# Patient Record
Sex: Female | Born: 1975
Health system: Southern US, Community
[De-identification: ages and names within clinical notes are randomized; demographics above are authoritative.]

## PROBLEM LIST (undated history)

## (undated) DIAGNOSIS — E785 Hyperlipidemia, unspecified: Secondary | ICD-10-CM

---

## 2004-07-07 ENCOUNTER — Emergency Department (HOSPITAL_COMMUNITY): Admission: EM | Admit: 2004-07-07 | Discharge: 2004-07-07 | Payer: Self-pay | Admitting: Emergency Medicine

## 2006-02-10 ENCOUNTER — Ambulatory Visit: Payer: Self-pay

## 2006-05-28 ENCOUNTER — Emergency Department: Payer: Self-pay | Admitting: Emergency Medicine

## 2007-09-14 ENCOUNTER — Ambulatory Visit: Payer: Self-pay | Admitting: Family Medicine

## 2008-02-14 ENCOUNTER — Emergency Department: Payer: Self-pay | Admitting: Emergency Medicine

## 2009-01-03 ENCOUNTER — Emergency Department: Payer: Self-pay | Admitting: Emergency Medicine

## 2009-10-30 ENCOUNTER — Ambulatory Visit: Payer: Self-pay | Admitting: Internal Medicine

## 2012-01-24 ENCOUNTER — Ambulatory Visit: Payer: Self-pay | Admitting: Emergency Medicine

## 2012-01-24 LAB — COMPREHENSIVE METABOLIC PANEL
Anion Gap: 8 (ref 7–16)
Bilirubin,Total: 0.6 mg/dL (ref 0.2–1.0)
Chloride: 103 mmol/L (ref 98–107)
Creatinine: 0.88 mg/dL (ref 0.60–1.30)
EGFR (African American): >60
Glucose: 80 mg/dL (ref 65–99)
Potassium: 3.4 mmol/L — ABNORMAL LOW (ref 3.5–5.1)
SGOT(AST): 14 U/L — ABNORMAL LOW (ref 15–37)
Total Protein: 8.2 g/dL (ref 6.4–8.2)

## 2012-01-24 LAB — CBC WITH DIFFERENTIAL/PLATELET
Basophil #: 0 10*3/uL (ref 0.0–0.1)
Lymphocyte #: 2.1 10*3/uL (ref 1.0–3.6)
MCH: 28 pg (ref 26.0–34.0)
MCV: 85 fL (ref 80–100)
Monocyte #: 0.4 x10 3/mm (ref 0.2–0.9)
Neutrophil %: 54.3 %
Platelet: 183 10*3/uL (ref 150–440)
RDW: 13.2 % (ref 11.5–14.5)
WBC: 5.9 10*3/uL (ref 3.6–11.0)

## 2012-01-24 LAB — IRON: Iron: 68 ug/dL (ref 50–170)

## 2012-03-30 ENCOUNTER — Ambulatory Visit: Payer: Self-pay | Admitting: Gastroenterology

## 2012-04-02 LAB — PATHOLOGY REPORT

## 2014-08-18 ENCOUNTER — Ambulatory Visit: Payer: Self-pay | Admitting: Physician Assistant

## 2016-03-02 ENCOUNTER — Other Ambulatory Visit: Payer: Self-pay | Admitting: Physician Assistant

## 2016-03-02 DIAGNOSIS — Z1231 Encounter for screening mammogram for malignant neoplasm of breast: Secondary | ICD-10-CM

## 2016-03-17 ENCOUNTER — Ambulatory Visit: Payer: Self-pay | Attending: Physician Assistant

## 2018-03-20 ENCOUNTER — Other Ambulatory Visit: Payer: Self-pay

## 2018-03-20 ENCOUNTER — Emergency Department: Payer: No Typology Code available for payment source

## 2018-03-20 ENCOUNTER — Emergency Department
Admission: EM | Admit: 2018-03-20 | Discharge: 2018-03-20 | Disposition: A | Discharge status: Home / Self Care | Payer: No Typology Code available for payment source | Attending: Emergency Medicine | Admitting: Emergency Medicine

## 2018-03-20 ENCOUNTER — Encounter: Payer: Self-pay | Admitting: Emergency Medicine

## 2018-03-20 DIAGNOSIS — R202 Paresthesia of skin: Secondary | ICD-10-CM | POA: Diagnosis not present

## 2018-03-20 DIAGNOSIS — R200 Anesthesia of skin: Secondary | ICD-10-CM | POA: Diagnosis present

## 2018-03-20 DIAGNOSIS — G459 Transient cerebral ischemic attack, unspecified: Secondary | ICD-10-CM

## 2018-03-20 HISTORY — DX: Hyperlipidemia, unspecified: E78.5

## 2018-03-20 LAB — COMPREHENSIVE METABOLIC PANEL
ALBUMIN: 3.9 g/dL (ref 3.5–5.0)
ALK PHOS: 27 U/L — AB (ref 38–126)
ALT: 12 U/L (ref 0–44)
AST: 18 U/L (ref 15–41)
Anion gap: 6 (ref 5–15)
BILIRUBIN TOTAL: 0.8 mg/dL (ref 0.3–1.2)
BUN: 11 mg/dL (ref 6–20)
CALCIUM: 8.7 mg/dL — AB (ref 8.9–10.3)
CO2: 22 mmol/L (ref 22–32)
CREATININE: 0.88 mg/dL (ref 0.44–1.00)
Chloride: 110 mmol/L (ref 98–111)
GFR calc Af Amer: >60 mL/min (ref >60)
GFR calc non Af Amer: >60 mL/min (ref >60)
GLUCOSE: 110 mg/dL — AB (ref 70–99)
Potassium: 3.8 mmol/L (ref 3.5–5.1)
SODIUM: 138 mmol/L (ref 135–145)
Total Protein: 7.3 g/dL (ref 6.5–8.1)

## 2018-03-20 LAB — CBC
HCT: 39.9 % (ref 35.0–47.0)
HEMOGLOBIN: 13.4 g/dL (ref 12.0–16.0)
MCH: 28.7 pg (ref 26.0–34.0)
MCHC: 33.7 g/dL (ref 32.0–36.0)
MCV: 85.3 fL (ref 80.0–100.0)
PLATELETS: 208 10*3/uL (ref 150–440)
RBC: 4.68 MIL/uL (ref 3.80–5.20)
RDW: 12.7 % (ref 11.5–14.5)
WBC: 5.8 10*3/uL (ref 3.6–11.0)

## 2018-03-20 LAB — URINALYSIS, ROUTINE W REFLEX MICROSCOPIC
BACTERIA UA: NONE SEEN
Bilirubin Urine: NEGATIVE
Glucose, UA: NEGATIVE mg/dL
Ketones, ur: NEGATIVE mg/dL
LEUKOCYTES UA: NEGATIVE
Nitrite: NEGATIVE
Protein, ur: NEGATIVE mg/dL
SPECIFIC GRAVITY, URINE: 1.008 (ref 1.005–1.030)
pH: 6 (ref 5.0–8.0)

## 2018-03-20 LAB — URINE DRUG SCREEN, QUALITATIVE (ARMC ONLY)
Amphetamines, Ur Screen: NOT DETECTED
BARBITURATES, UR SCREEN: NOT DETECTED
Benzodiazepine, Ur Scrn: NOT DETECTED
COCAINE METABOLITE, UR ~~LOC~~: NOT DETECTED
Cannabinoid 50 Ng, Ur ~~LOC~~: NOT DETECTED
MDMA (Ecstasy)Ur Screen: NOT DETECTED
METHADONE SCREEN, URINE: NOT DETECTED
Opiate, Ur Screen: NOT DETECTED
Phencyclidine (PCP) Ur S: NOT DETECTED
Tricyclic, Ur Screen: NOT DETECTED

## 2018-03-20 LAB — DIFFERENTIAL
BASOS ABS: 0 10*3/uL (ref 0–0.1)
BASOS PCT: 1 %
EOS ABS: 0.2 10*3/uL (ref 0–0.7)
Eosinophils Relative: 3 %
LYMPHS ABS: 2.2 10*3/uL (ref 1.0–3.6)
Lymphocytes Relative: 38 %
Monocytes Absolute: 0.3 10*3/uL (ref 0.2–0.9)
Monocytes Relative: 6 %
NEUTROS PCT: 52 %
Neutro Abs: 3.1 10*3/uL (ref 1.4–6.5)

## 2018-03-20 LAB — TROPONIN I: Troponin I: <0.03 ng/mL (ref <0.03)

## 2018-03-20 LAB — HEMOGLOBIN A1C
Hgb A1c MFr Bld: 5.5 % (ref 4.8–5.6)
MEAN PLASMA GLUCOSE: 111.15 mg/dL

## 2018-03-20 LAB — POCT PREGNANCY, URINE: Preg Test, Ur: NEGATIVE

## 2018-03-20 LAB — LIPID PANEL
CHOL/HDL RATIO: 3.7 ratio
Cholesterol: 198 mg/dL (ref 0–200)
HDL: 54 mg/dL (ref >40)
LDL Cholesterol: 127 mg/dL — ABNORMAL HIGH (ref 0–99)
Triglycerides: 85 mg/dL (ref <150)
VLDL: 17 mg/dL (ref 0–40)

## 2018-03-20 LAB — APTT: APTT: 27 s (ref 24–36)

## 2018-03-20 LAB — GLUCOSE, CAPILLARY: GLUCOSE-CAPILLARY: 100 mg/dL — AB (ref 70–99)

## 2018-03-20 LAB — ETHANOL: Alcohol, Ethyl (B): <10 mg/dL (ref <10)

## 2018-03-20 LAB — PROTIME-INR
INR: 0.96
Prothrombin Time: 12.7 seconds (ref 11.4–15.2)

## 2018-03-20 MED ORDER — ASPIRIN EC 325 MG PO TBEC
325.0000 mg | DELAYED_RELEASE_TABLET | Freq: Every day | ORAL | Status: DC
  Administered 2018-03-20: 325 mg via ORAL
  Filled 2018-03-20: qty 1

## 2018-03-20 MED ORDER — LORAZEPAM 2 MG/ML IJ SOLN
0.5000 mg | Freq: Once | INTRAMUSCULAR | Status: DC
  Filled 2018-03-20: qty 1

## 2018-03-20 MED ORDER — ONDANSETRON HCL 4 MG/2ML IJ SOLN
4.0000 mg | Freq: Once | INTRAMUSCULAR | Status: AC
  Administered 2018-03-20: 4 mg via INTRAVENOUS

## 2018-03-20 MED ORDER — ONDANSETRON HCL 4 MG/2ML IJ SOLN
INTRAMUSCULAR | Status: AC
  Filled 2018-03-20: qty 2

## 2018-03-20 NOTE — Code Documentation (Signed)
Pt arrives with complaints of left lip numbness, states at 0746 she developed left lip numbness, while at work she developed a burning sensation in her left hand which resolved but left facial numbness then progressed, initial NIHSS 1, No tPA given, Dr. Thad Rangereynolds at bedside, MRI ordered, [place]

## 2018-03-20 NOTE — Progress Notes (Signed)
CODE STROKE- PHARMACY COMMUNICATION   Time CODE STROKE called/page received: 0855  Time response to CODE STROKE was made (in person or via phone): 0900  Time Stroke Kit retrieved from [facility] (only if needed): N/A  Name of Provider/Nurse contacted:Olivia   Past Medical History:  Diagnosis Date  . Hyperlipemia    Prior to Admission medications   Medication Sig Start Date End Date Taking? Authorizing Provider  aspirin EC 81 MG tablet Take 81 mg by mouth every evening.   Yes [provider]  atorvastatin (LIPITOR) 10 MG tablet Take 10 mg by mouth every evening.   Yes [provider]    Charlett Nose ,PharmD Clinical Pharmacist  03/20/2018  9:16 AM

## 2018-03-20 NOTE — ED Notes (Signed)
Called MRI and they stated they are with a patient currently and when they finish up they will come and get our patient next. Pt informed. Pt ready for MRI at this time.

## 2018-03-20 NOTE — ED Notes (Signed)
MD Williams at bedside.  

## 2018-03-20 NOTE — ED Notes (Signed)
Patient transported to MRI 

## 2018-03-20 NOTE — ED Notes (Signed)
Code  Stroke  Called  To 333 

## 2018-03-20 NOTE — ED Notes (Signed)
LKW 0730

## 2018-03-20 NOTE — ED Triage Notes (Signed)
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 Pt states resolved quickly but numbness came back to face, left side. Pt ambulated to ED. Stable at this time

## 2018-03-20 NOTE — ED Notes (Signed)
Pt back from MRI 

## 2018-03-20 NOTE — Consult Note (Signed)
Referring Physician: Mayford KnifeWilliams    Chief Complaint: Left facial numbness  HPI: Kaitlyn Pearson is an 42 y.o. 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  Symptoms resolved and the patient continued about her duties.  Within 15 minutes symptoms returned again involving her entire left face.  They have improved to just being around the left side of her lips but she has not returned to baseline.  Patient presented to the ED.  Code stoke called.  Initial NIHSS of 1.    Date last known well: Date: 03/20/2018 Time last known well: Time: 08:00 tPA Given: No: Minimal NIHSS  Past Medical History:  Diagnosis Date  . Hyperlipemia     History reviewed. No pertinent surgical history.  Family history: Multiple siblings with hyperlipidemia and DM  Social History:  reports that she has never smoked. She has never used smokeless tobacco. She reports that she drank alcohol. She reports that she has current or past drug history.  Allergies: No Known Allergies  Medications: I have reviewed the patient's current medications. Prior to Admission:  Prior to Admission medications   Medication Sig Start Date End Date Taking? Authorizing Provider  aspirin EC 81 MG tablet Take 81 mg by mouth every evening.   Yes [provider]  atorvastatin (LIPITOR) 10 MG tablet Take 10 mg by mouth every evening.   Yes [provider]     ROS: History obtained from the patient  General ROS: negative for - chills, fatigue, fever, night sweats, weight gain or weight loss Psychological ROS: negative for - behavioral disorder, hallucinations, memory difficulties, mood swings or suicidal ideation Ophthalmic ROS: negative for - blurry vision, double vision, eye pain or loss of vision ENT ROS: negative for - epistaxis, nasal discharge, oral lesions, sore throat, tinnitus or vertigo Allergy and Immunology ROS: negative for - hives or  itchy/watery eyes Hematological and Lymphatic ROS: negative for - bleeding problems, bruising or swollen lymph nodes Endocrine ROS: negative for - galactorrhea, hair pattern changes, polydipsia/polyuria or temperature intolerance Respiratory ROS: negative for - cough, hemoptysis, shortness of breath or wheezing Cardiovascular ROS: negative for - chest pain, dyspnea on exertion, edema or irregular heartbeat Gastrointestinal ROS: negative for - abdominal pain, diarrhea, hematemesis, nausea/vomiting or stool incontinence Genito-Urinary ROS: negative for - dysuria, hematuria, incontinence or urinary frequency/urgency Musculoskeletal ROS: negative for - joint swelling or muscular weakness Neurological ROS: as noted in HPI Dermatological ROS: negative for rash and skin lesion changes  Physical Examination: Pulse 91, temperature 97.6 F (36.4 C), temperature source Axillary, resp. rate 18, height 5\' 5"  (1.651 m), weight 95.7 kg, SpO2 100 %.  HEENT-  Normocephalic, no lesions, without obvious abnormality.  Normal external eye and conjunctiva.  Normal TM's bilaterally.  Normal auditory canals and external ears. Normal external nose, mucus membranes and septum.  Normal pharynx. Cardiovascular- S1, S2 normal, pulses palpable throughout   Lungs- chest clear, no wheezing, rales, normal symmetric air entry Abdomen- soft, non-tender; bowel sounds normal; no masses,  no organomegaly Extremities- no edema Lymph-no adenopathy palpable Musculoskeletal-no joint tenderness, deformity or swelling Skin-warm and dry, no hyperpigmentation, vitiligo, or suspicious lesions  Neurological Examination   Mental Status: Alert, oriented, thought content appropriate.  Speech fluent without evidence of aphasia.  Able to follow 3 step commands without difficulty. Cranial Nerves: II: Discs flat bilaterally; Visual fields grossly normal, pupils equal, round, reactive to light and accommodation III,IV, VI: ptosis not  present,  extra-ocular motions intact bilaterally V,VII: smile symmetric, facial light touch sensation decreased on the left side of the mouth VIII: hearing normal bilaterally IX,X: gag reflex present XI: bilateral shoulder shrug XII: midline tongue extension Motor: Right : Upper extremity   5/5    Left:     Upper extremity   5/5  Lower extremity   5/5     Lower extremity   5/5 Tone and bulk:normal tone throughout; no atrophy noted Sensory: Pinprick and light touch intact throughout, bilaterally Deep Tendon Reflexes: 2+ and symmetric throughout Plantars: Right: downgoing   Left: downgoing Cerebellar: Normal finger-to-nose and normal heel-to-shin testing bilaterally Gait: normal gait and station   Laboratory Studies:  Basic Metabolic Panel: Recent Labs  Lab 03/20/18 0835  NA 138  K 3.8  CL 110  CO2 22  GLUCOSE 110*  BUN 11  CREATININE 0.88  CALCIUM 8.7*    Liver Function Tests: Recent Labs  Lab 03/20/18 0835  AST 18  ALT 12  ALKPHOS 27*  BILITOT 0.8  PROT 7.3  ALBUMIN 3.9   No results for input(s): LIPASE, AMYLASE in the last 168 hours. No results for input(s): AMMONIA in the last 168 hours.  CBC: Recent Labs  Lab 03/20/18 0835  WBC 5.8  NEUTROABS 3.1  HGB 13.4  HCT 39.9  MCV 85.3  PLT 208    Cardiac Enzymes: Recent Labs  Lab 03/20/18 0835  TROPONINI <0.03    BNP: Invalid input(s): POCBNP  CBG: Recent Labs  Lab 03/20/18 0843  GLUCAP 100*    Microbiology: No results found for this or any previous visit.  Coagulation Studies: Recent Labs    03/20/18 0835  LABPROT 12.7  INR 0.96    Urinalysis: No results for input(s): COLORURINE, LABSPEC, PHURINE, GLUCOSEU, HGBUR, BILIRUBINUR, KETONESUR, PROTEINUR, UROBILINOGEN, NITRITE, LEUKOCYTESUR in the last 168 hours.  Invalid input(s): APPERANCEUR  Lipid Panel: No results found for: CHOL, TRIG, HDL, CHOLHDL, VLDL, LDLCALC  HgbA1C: No results found for: HGBA1C  Urine Drug Screen:  No results found  for: LABOPIA, COCAINSCRNUR, LABBENZ, AMPHETMU, THCU, LABBARB  Alcohol Level: No results for input(s): ETH in the last 168 hours.  Other results: EKG: sinus rhythm at 87 bpm.  Imaging: Ct Head Code Stroke Wo Contrast  Addendum Date: 03/20/2018   ADDENDUM REPORT: 03/20/2018 08:51 ADDENDUM: Study discussed by telephone with Dr. Daryel November on 03/20/2018 at 0844 hours. Electronically Signed   By: Odessa Fleming M.D.   On: 03/20/2018 08:51   Result Date: 03/20/2018 CLINICAL DATA:  Code stroke. 42 year old female with symptom onset at 0746 hours. Perioral numbness, left side numbness. EXAM: CT HEAD WITHOUT CONTRAST TECHNIQUE: Contiguous axial images were obtained from the base of the skull through the vertex without intravenous contrast. COMPARISON:  Head CT without contrast 07/07/2004. FINDINGS: Brain: Cerebral volume remains normal. No midline shift, ventriculomegaly, mass effect, evidence of mass lesion, intracranial hemorrhage or evidence of cortically based acute infarction. Gray-white matter differentiation is within normal limits throughout the brain. Partially empty sella, appears chronic. Vascular: No suspicious intracranial vascular hyperdensity. Skull: Negative. Sinuses/Orbits: Visualized paranasal sinuses and mastoids are stable and well pneumatized. Other: Visualized orbits and scalp soft tissues are within normal limits. ASPECTS (Alberta Stroke Program Early CT Score) 7 - Ganglionic level infarction (caudate, lentiform nuclei, internal capsule, insula, M1-M3 cortex): - Supraganglionic infarction (M4-M6 cortex): 3 Total score (0-10 with 10 being normal): 10 IMPRESSION: 1. Stable and negative noncontrast CT appearance of the brain. 2. ASPECTS is 10. Electronically Signed:  By: Odessa Fleming M.D. On: 03/20/2018 08:40    Assessment: 42 y.o. female presenting with left facial numbness.  Remaining neurological examination is unremarkable.  Patient on ASA at home.  Head CT reviewed and shows no acute changes.   Symptoms persist, therefore further work up recommended.    Stroke Risk Factors - hyperlipidemia  Plan: 1. HgbA1c, fasting lipid panel 2. MRI of the brain without contrast 3. ASA 325mg  s/p swallow screen 4. NPO until RN stroke swallow screen 5. Telemetry monitoring 6. Frequent neuro checks  Thana Farr, MD Neurology [phone] 03/20/2018, 9:15 AM  Addendum: MRI of the brain reviewed and shows no acute changes.  A1c 5.5, LDL 127.  Recommendations: 1. Continue ASA 2. Increase statin with a goal LDL<70.   3. Patient to have carotid dopplers performed on an outpatient basis  Thana Farr, MD Neurology [phone]

## 2018-03-20 NOTE — ED Provider Notes (Signed)
[facility] Emergency Department Provider Note       Time seen: ----------------------------------------- 8:25 AM on 03/20/2018 -----------------------------------------   I have reviewed the triage vital signs and the nursing notes.  HISTORY   Chief Complaint Numbness    HPI Kaitlyn Pearson is a 42 y.o. female with no significant past medical history who presents to the ED for left-sided facial numbness.  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  Her only complaint is left-sided facial numbness.  She has never had this happen before today.  No past medical history on file.  There are no active problems to display for this patient.  Allergies Patient has no allergy information on record.  Social History Social History   Tobacco Use  . Smoking status: Not on file  Substance Use Topics  . Alcohol use: Not on file  . Drug use: Not on file   Review of Systems Constitutional: Negative for fever. Eyes: Negative for vision changes ENT:  Negative for congestion, sore throat Cardiovascular: Negative for chest pain. Respiratory: Negative for shortness of breath. Gastrointestinal: Negative for abdominal pain, vomiting and diarrhea. Musculoskeletal: Negative for back pain. Skin: Negative for rash. Neurological: Positive for left-sided facial numbness  All systems negative/normal/unremarkable except as stated in the HPI  ____________________________________________   PHYSICAL EXAM:  VITAL SIGNS: ED Triage Vitals  Enc Vitals Group     BP      Pulse      Resp      Temp      Temp src      SpO2      Weight      Height      Head Circumference      Peak Flow      Pain Score    Pain Loc      Pain Edu?      Excl. in GC?    Constitutional: Alert and oriented.  Anxious, no acute distress Eyes: Conjunctivae are normal. Normal extraocular movements. ENT   Head: Normocephalic and atraumatic.   Nose: No congestion/rhinnorhea.   Mouth/Throat: Mucous membranes are moist.   Neck: No stridor. Cardiovascular: Normal rate, regular rhythm. No murmurs, rubs, or gallops. Respiratory: Normal respiratory effort without tachypnea nor retractions. Breath sounds are clear and equal bilaterally. No wheezes/rales/rhonchi. Gastrointestinal: Soft and nontender. Normal bowel sounds Musculoskeletal: Nontender with normal range of motion in extremities. No lower extremity tenderness nor edema. Neurologic:  Normal speech and language.  Decreased sensation on the left side of face compared to right.  Cranial nerves are otherwise normal, strength and sensation elsewhere is normal. Skin:  Skin is warm, dry and intact. No rash noted. Psychiatric: Mood and affect are normal. Speech and behavior are normal.  ____________________________________________  EKG: Interpreted by me.  Sinus rhythm rate 89 bpm, normal PR interval, normal QRS, normal QT  ____________________________________________  ED COURSE:  As part of my medical decision making, I reviewed the following data within the electronic MEDICAL RECORD NUMBER History obtained from family if available, nursing notes, old chart and ekg, as well as notes from prior ED visits. Patient presented for acute left facial numbness, we will assess with labs and imaging as indicated at this time.   Procedures  ____________________________________________   LABS (pertinent positives/negatives)  Labs Reviewed  COMPREHENSIVE METABOLIC PANEL - Abnormal; Notable for the following components:      Result Value   Glucose, Bld 110 (*)    Calcium 8.7 (*)    Alkaline Phosphatase 27 (*)    All other components within normal limits  URINALYSIS,  ROUTINE W REFLEX MICROSCOPIC - Abnormal; Notable for the following components:   Color, Urine STRAW (*)    APPearance CLEAR (*)    Hgb urine dipstick LARGE (*)    All other components within normal limits  GLUCOSE, CAPILLARY - Abnormal; Notable for the following components:   Glucose-Capillary 100 (*)    All other components within normal limits  LIPID PANEL - Abnormal; Notable for the following components:   LDL Cholesterol 127 (*)    All other components within normal limits  ETHANOL  PROTIME-INR  APTT  CBC  DIFFERENTIAL  TROPONIN I  URINE DRUG SCREEN, QUALITATIVE ([facility] ONLY)  HEMOGLOBIN A1C  POC URINE PREG, ED  POCT PREGNANCY, URINE    RADIOLOGY Images were viewed by me  CT head Was unremarkable MRI is negative ____________________________________________  DIFFERENTIAL DIAGNOSIS   TIA, CVA, anxiety  FINAL ASSESSMENT AND PLAN  Left facial numbness   Plan: The patient had presented for sudden left facial numbness. Patient's labs were reassuring. Patient's imaging also reassuring.  No clear etiology for her symptoms was identified.  Her symptoms have resolved.  She was seen by neurology as well as myself.  She is cleared for outpatient follow-up.   Ulice DashJohnathan E Bauer Ausborn, MD   Note: This note was generated in part or whole with voice recognition software. Voice recognition is usually quite accurate but there are transcription errors that can and very often do occur. I apologize for any typographical errors that were not detected and corrected.     Emily FilbertWilliams, Caden Fukushima E, MD 03/20/18 [phone]

## 2018-03-20 NOTE — Progress Notes (Signed)
   03/20/18 0920  Clinical Encounter Type  Visited With Health care provider  Visit Type Initial;ED  Referral From Nurse  Consult/Referral To Chaplain  Spiritual Encounters  Spiritual Needs Other (Comment)   CH received a page for a Code Stroke. As I reported to the patient's room the MD was entering for a exam and consult. I spoke with the patient's RN. The patient was being scheduled for a MRI, I will follow up later after the patient's MRI.

## 2019-04-21 ENCOUNTER — Telehealth: Payer: No Typology Code available for payment source | Admitting: Family

## 2019-04-21 DIAGNOSIS — Z20822 Contact with and (suspected) exposure to covid-19: Secondary | ICD-10-CM

## 2019-04-21 DIAGNOSIS — Z20828 Contact with and (suspected) exposure to other viral communicable diseases: Secondary | ICD-10-CM

## 2019-04-21 MED ORDER — BENZONATATE 100 MG PO CAPS: 100.0000 mg | ORAL_CAPSULE | Freq: Three times a day (TID) | ORAL | 0 refills | Status: AC | PRN

## 2019-04-21 NOTE — Progress Notes (Signed)
E-Visit for Corona Virus Screening   Your current symptoms could be consistent with the coronavirus.  Many health care providers can now test patients at their office but not all are.  [facility] has multiple testing sites. For information on our COVID testing locations and hours go to [email or web address]  Please quarantine yourself while awaiting your test results.  We are enrolling you in our MyChart Home Montioring for COVID19 . Daily you will receive a questionnaire within the MyChart website. Our COVID 19 response team willl be monitoriing your responses daily.  You can go to one of the testing sites listed below, while they are opened (see hours). You do not need an order and will stay in your car during the test. You do need to self isolate until your results return and if positive 10 days from when your symptoms started and until you are 3 days fever free.   Testing Locations (Monday - Friday, 8 a.m. - 3:30 p.m.) . [place]: [facility], [address], [place], [place]  . [place]: [facility], [address], [place], [place] (entrance off [address])  . [place]: (Closed each Monday): Testing site relocated to the short stay covered drive at [facility]. (Use the [address] entrance to [facility] next to [facility].)   COVID-19 is a respiratory illness with symptoms that are similar to the flu. Symptoms are typically mild to moderate, but there have been cases of severe illness and death due to the virus. The following symptoms may appear 2-14 days after exposure: . Fever . Cough . Shortness of breath or difficulty breathing . Chills . Repeated shaking with chills . Muscle pain . Headache . Sore throat . New loss of taste or smell . Fatigue . Congestion or runny nose . Nausea or vomiting . Diarrhea  It is vitally important that if you feel that you  have an infection such as this virus or any other virus that you stay home and away from places where you may spread it to others.  You should self-quarantine for 14 days if you have symptoms that could potentially be coronavirus or have been in close contact a with a person diagnosed with COVID-19 within the last 2 weeks. You should avoid contact with people age 65 and older.   You should wear a mask or cloth face covering over your nose and mouth if you must be around other people or animals, including pets (even at home). Try to stay at least 6 feet away from other people. This will protect the people around you.  You can use medication such as A prescription cough medication called Tessalon Perles 100 mg. You may take 1-2 capsules every 8 hours as needed for cough.  You may also take acetaminophen (Tylenol) as needed for fever.   Reduce your risk of any infection by using the same precautions used for avoiding the common cold or flu:  . Wash your hands often with soap and warm water for at least 20 seconds.  If soap and water are not readily available, use an alcohol-based hand sanitizer with at least 60% alcohol.  . If coughing or sneezing, cover your mouth and nose by coughing or sneezing into the elbow areas of your shirt or coat, into a tissue or into your sleeve (not your hands). . Avoid shaking hands with others and consider head nods or verbal greetings only. . Avoid touching your   eyes, nose, or mouth with unwashed hands.  . Avoid close contact with people who are sick. . Avoid places or events with large numbers of people in one location, like concerts or sporting events. . Carefully consider travel plans you have or are making. . If you are planning any travel outside or inside the [place], visit the CDC's Travelers' Health webpage for the latest health notices. . If you have some symptoms but not all symptoms, continue to monitor at home and seek medical attention if your symptoms  worsen. . If you are having a medical emergency, call 911.  HOME CARE . Only take medications as instructed by your medical team. . Drink plenty of fluids and get plenty of rest. . A steam or ultrasonic humidifier can help if you have congestion.   GET HELP RIGHT AWAY IF YOU HAVE EMERGENCY WARNING SIGNS** FOR COVID-19. If you or someone is showing any of these signs seek emergency medical care immediately. Call 911 or proceed to your closest emergency facility if: . You develop worsening high fever. . Trouble breathing . Bluish lips or face . Persistent pain or pressure in the chest . New confusion . Inability to wake or stay awake . You cough up blood. . Your symptoms become more severe  **This list is not all possible symptoms. Contact your medical provider for any symptoms that are sever or concerning to you.   MAKE SURE YOU   Understand these instructions.  Will watch your condition.  Will get help right away if you are not doing well or get worse.  Your e-visit answers were reviewed by a board certified advanced clinical practitioner to complete your personal care plan.  Depending on the condition, your plan could have included both over the counter or prescription medications.  If there is a problem please reply once you have received a response from your provider.  Your safety is important to us.  If you have drug allergies check your prescription carefully.    You can use MyChart to ask questions about today's visit, request a non-urgent call back, or ask for a work or school excuse for 24 hours related to this e-Visit. If it has been greater than 24 hours you will need to follow up with your provider, or enter a new e-Visit to address those concerns. You will get an e-mail in the next two days asking about your experience.  I hope that your e-visit has been valuable and will speed your recovery. Thank you for using e-visits.  Approximately 5 minutes was spent documenting  and reviewing patient's chart.    

## 2019-04-23 ENCOUNTER — Emergency Department
Admission: EM | Admit: 2019-04-23 | Discharge: 2019-04-23 | Disposition: A | Discharge status: Home / Self Care | Payer: 59 | Attending: Student in an Organized Health Care Education/Training Program | Admitting: Student in an Organized Health Care Education/Training Program

## 2019-04-23 ENCOUNTER — Other Ambulatory Visit: Payer: Self-pay

## 2019-04-23 ENCOUNTER — Encounter: Payer: Self-pay | Admitting: *Deleted

## 2019-04-23 DIAGNOSIS — M791 Myalgia, unspecified site: Secondary | ICD-10-CM | POA: Insufficient documentation

## 2019-04-23 DIAGNOSIS — Z7982 Long term (current) use of aspirin: Secondary | ICD-10-CM | POA: Insufficient documentation

## 2019-04-23 DIAGNOSIS — R6883 Chills (without fever): Secondary | ICD-10-CM | POA: Diagnosis not present

## 2019-04-23 DIAGNOSIS — J029 Acute pharyngitis, unspecified: Secondary | ICD-10-CM | POA: Diagnosis not present

## 2019-04-23 DIAGNOSIS — Z20822 Contact with and (suspected) exposure to covid-19: Secondary | ICD-10-CM

## 2019-04-23 DIAGNOSIS — Z20828 Contact with and (suspected) exposure to other viral communicable diseases: Secondary | ICD-10-CM | POA: Diagnosis not present

## 2019-04-23 LAB — SARS CORONAVIRUS 2 (TAT 6-24 HRS): SARS Coronavirus 2: NEGATIVE

## 2019-04-23 NOTE — ED Triage Notes (Signed)
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  Pt has sore throat, chills.  Pt alert  Speech clear.  Pt was suppose to leave for [place] today.

## 2019-04-23 NOTE — ED Notes (Signed)
Pt ambulatory from triage, states had a rapid covid test yesterday at [facility] and was negative. PT is supposed to be traveling to [place] and would like to be tested again. PT reports body aches and sore throat. No cough or fever.

## 2019-04-23 NOTE — ED Provider Notes (Signed)
[facility] Emergency Department Provider Note  ____________________________________________  Time seen: Approximately 3:29 PM  I have reviewed the triage vital signs and the nursing notes.   HISTORY  Chief Complaint covid exposure    HPI Kaitlyn Pearson is a 43 y.o. female that presents to the emergency department requesting a Covid test.  Patient is an NP here.  She states that she was exposed to a patient 7 days ago with Covid.  This morning, she had body aches, sore throat, chills.  She was supposed to leave for [place] today for her father's funeral.  She missed her flight and rebooked tonight at midnight.  [facility]  She would like to be tested again for reassurance to fly. No cough, SOB, CP.  Past Medical History:  Diagnosis Date  . Hyperlipemia     There are no active problems to display for this patient.   No past surgical history on file.  Prior to Admission medications   Medication Sig Start Date End Date Taking? Authorizing Provider  aspirin EC 81 MG tablet Take 81 mg by mouth every evening.    [provider]  atorvastatin (LIPITOR) 10 MG tablet Take 10 mg by mouth every evening.    [provider]  benzonatate (TESSALON PERLES) 100 MG capsule Take 1 capsule (100 mg total) by mouth 3 (three) times daily as needed. 04/21/19   Junie Spencer, FNP    Allergies Patient has no known allergies.  No family history on file.  Social History Social History   Tobacco Use  . Smoking status: Never Smoker  . Smokeless tobacco: Never Used  Substance Use Topics  . Alcohol use: Not Currently  . Drug use: Not Currently     Review of Systems  Constitutional: No fever/chills ENT: No upper respiratory complaints. Cardiovascular: No chest pain. Respiratory: No cough. No SOB. Gastrointestinal: No abdominal pain.  No nausea, no vomiting.  Musculoskeletal: Negative for  musculoskeletal pain. Skin: Negative for rash, abrasions, lacerations, ecchymosis. Neurological: Negative for headaches   ____________________________________________   PHYSICAL EXAM:  VITAL SIGNS: ED Triage Vitals  Enc Vitals Group     BP 04/23/19 1515 (!) 156/100     Pulse Rate 04/23/19 1515 (!) 118     Resp 04/23/19 1515 20     Temp 04/23/19 1515 98.9 F (37.2 C)     Temp Source 04/23/19 1515 Oral     SpO2 04/23/19 1515 99 %     Weight 04/23/19 1510 200 lb (90.7 kg)     Height 04/23/19 1510 5\' 5"  (1.651 m)     Head Circumference --      Peak Flow --      Pain Score 04/23/19 1510 0     Pain Loc --      Pain Edu? --      Excl. in GC? --      Constitutional: Alert and oriented. Anxious Eyes: Conjunctivae are normal. PERRL. EOMI. Head: Atraumatic. ENT:      Ears:      Nose: No congestion/rhinnorhea.      Mouth/Throat: Mucous membranes are moist.  Neck: No stridor.  Cardiovascular: Normal rate, regular rhythm.  Good peripheral circulation. Respiratory: Normal respiratory effort without tachypnea or retractions. Lungs CTAB. Good air entry to the bases with no decreased or absent breath sounds. Musculoskeletal: Full range of motion to all extremities. No gross deformities appreciated. Neurologic:  Normal speech and language. No  gross focal neurologic deficits are appreciated.  Skin:  Skin is warm, dry and intact. No rash noted. Psychiatric: Mood and affect are normal. Speech and behavior are normal. Patient exhibits appropriate insight and judgement.   ____________________________________________   LABS (all labs ordered are listed, but only abnormal results are displayed)  Labs Reviewed  SARS CORONAVIRUS 2 (TAT 6-24 HRS)   ____________________________________________  EKG   ____________________________________________  RADIOLOGY   No results found.  ____________________________________________    PROCEDURES  Procedure(s) performed:     Procedures    Medications - No data to display   ____________________________________________   INITIAL IMPRESSION / ASSESSMENT AND PLAN / ED COURSE  Pertinent labs & imaging results that were available during my care of the patient were reviewed by me and considered in my medical decision making (see chart for details).  Review of the [place] CSRS was performed in accordance of the [place] prior to dispensing any controlled drugs.     Patient presented to emergency department requesting a Covid test.  Vital signs and exam are reassuring.  Covid test was ordered.  Patient was encouraged not to fly.  Patient states that she is going to cancel her flight and monitor symptoms and quarantine here.   Patient is to follow up with primary care as directed. Patient is given ED precautions to return to the ED for any worsening or new symptoms.   Kaitlyn Pearson was evaluated in Emergency Department on 04/23/2019 for the symptoms described in the history of present illness. She was evaluated in the context of the global COVID-19 pandemic, which necessitated consideration that the patient might be at risk for infection with the SARS-CoV-2 virus that causes COVID-19. Institutional protocols and algorithms that pertain to the evaluation of patients at risk for COVID-19 are in a state of rapid change based on information released by regulatory bodies including the CDC and federal and state organizations. These policies and algorithms were followed during the patient's care in the ED.  ____________________________________________  FINAL CLINICAL IMPRESSION(S) / ED DIAGNOSES  Final diagnoses:  Exposure to COVID-19 virus      NEW MEDICATIONS STARTED DURING THIS VISIT:  ED Discharge Orders    None          This chart was dictated using voice recognition software/Dragon. Despite best efforts to proofread, errors can occur which can change the meaning. Any change was purely  unintentional.    Laban Emperor, PA-C 04/23/19 1720    Merlyn Lot, MD 04/23/19 1734

## 2019-06-27 ENCOUNTER — Ambulatory Visit
Admission: EM | Admit: 2019-06-27 | Discharge: 2019-06-27 | Disposition: A | Discharge status: Home / Self Care | Payer: No Typology Code available for payment source | Attending: Emergency Medicine | Admitting: Emergency Medicine

## 2019-06-27 ENCOUNTER — Encounter: Payer: Self-pay | Admitting: Emergency Medicine

## 2019-06-27 ENCOUNTER — Other Ambulatory Visit: Payer: Self-pay

## 2019-06-27 DIAGNOSIS — B349 Viral infection, unspecified: Secondary | ICD-10-CM | POA: Insufficient documentation

## 2019-06-27 DIAGNOSIS — U071 COVID-19: Secondary | ICD-10-CM | POA: Diagnosis present

## 2019-06-27 LAB — SARS CORONAVIRUS 2 AG (30 MIN TAT): SARS Coronavirus 2 Ag: POSITIVE — AB

## 2019-06-27 LAB — INFLUENZA PANEL BY PCR (TYPE A & B)
Influenza A By PCR: NEGATIVE
Influenza B By PCR: NEGATIVE

## 2019-06-27 NOTE — ED Provider Notes (Signed)
[facility] ____________________________________________  Time seen: Approximately 6:32 PM  I have reviewed the triage vital signs and the nursing notes.   HISTORY  Chief Complaint Cough   HPI Kaitlyn Pearson is a 43 y.o. female presenting for evaluation of 3 days of chills, body aches, diffuse abdominal discomfort, decreased appetite, nausea, and fatigue.  Denies congestion.  States rare cough.  Denies chest pain or shortness of breath.  States for the last 3 days has not been able to really taste or smell well.  States not having a lot of respiratory symptoms.  Has positive direct COVID-19 exposures through work.  Has not been taking any over-the-counter medications for the same complaints.  Denies vomiting or diarrhea.  Reports otherwise doing well.  Denies recent sickness.  Denies history of hypertension, diabetes or chronic respiratory problems.  Danella Penton, MD : PCP    Past Medical History:  Diagnosis Date  . Hyperlipemia     There are no problems to display for this patient.   Past Surgical History:  Procedure Laterality Date  . CESAREAN SECTION     x2     No current facility-administered medications for this encounter.  Current Outpatient Medications:  .  Norethindrone Acetate-Ethinyl Estradiol (JUNEL 1.5/30) 1.5-30 MG-MCG tablet, TAKE 1 TABLET BY MOUTH ONCE DAILY TAKE DAILY FOR 21 DAYS, NO DRUG FOR 7 DAYS, THEN START NEW PACK., Disp: , Rfl:  .  aspirin EC 81 MG tablet, Take 81 mg by mouth every evening., Disp: , Rfl:  .  atorvastatin (LIPITOR) 10 MG tablet, Take 10 mg by mouth every evening., Disp: , Rfl:  .  benzonatate (TESSALON PERLES) 100 MG capsule, Take 1 capsule (100 mg total) by mouth 3 (three) times daily as needed., Disp: 20 capsule, Rfl: 0  Allergies Patient has no known allergies.  Family History  Problem Relation Age of Onset  . Healthy Mother   . Cancer Father        lung    Social History Social History   Tobacco  Use  . Smoking status: Never Smoker  . Smokeless tobacco: Never Used  Substance Use Topics  . Alcohol use: Not Currently  . Drug use: Not Currently    Review of Systems Constitutional: No fever.  Positive body aches. ENT: No sore throat.  As above. Cardiovascular: Denies chest pain. Respiratory: Denies shortness of breath. Gastrointestinal: No abdominal pain.   Genitourinary: Negative for dysuria. Musculoskeletal: Negative for back pain. Skin: Negative for rash. Neurological: Positive headache.    ____________________________________________   PHYSICAL EXAM:  VITAL SIGNS: ED Triage Vitals  Enc Vitals Group     BP 06/27/19 1648 108/69     Pulse Rate 06/27/19 1648 75     Resp 06/27/19 1648 20     Temp 06/27/19 1648 98.8 F (37.1 C)     Temp Source 06/27/19 1648 Oral     SpO2 06/27/19 1648 100 %     Weight 06/27/19 1645 196 lb (88.9 kg)     Height 06/27/19 1645 5\' 5"  (1.651 m)     Head Circumference --      Peak Flow --      Pain Score 06/27/19 1645 8     Pain Loc --      Pain Edu? --      Excl. in GC? --    Constitutional: Alert and oriented.  Appears ill. Eyes: Conjunctivae are normal.  Head: Atraumatic. No sinus tenderness to palpation. No swelling. No erythema.  Ears: no erythema, normal TMs bilaterally.   Nose: No nasal congestion.  Mouth/Throat: Mucous membranes are moist. No pharyngeal erythema. No tonsillar swelling or exudate.  Neck: No stridor.  No cervical spine tenderness to palpation. Hematological/Lymphatic/Immunilogical: No cervical lymphadenopathy. Cardiovascular: Normal rate, regular rhythm. Grossly normal heart sounds.  Good peripheral circulation. Respiratory: Normal respiratory effort.  No retractions. No wheezes, rales or rhonchi. Good air movement.  Gastrointestinal: Mild diffuse abdominal tenderness palpation.  No point abdominal tenderness.  No guarding.  No CVA tenderness. Musculoskeletal: Ambulatory with steady gait. Neurologic:  Normal  speech and language. No gait instability. Skin:  Skin appears warm, dry and intact. No rash noted. Psychiatric: Mood and affect are normal. Speech and behavior are normal.  ___________________________________________   LABS (all labs ordered are listed, but only abnormal results are displayed)  Labs Reviewed  SARS CORONAVIRUS 2 AG (30 MIN TAT) - Abnormal; Notable for the following components:      Result Value   SARS Coronavirus 2 Ag POSITIVE (*)    All other components within normal limits  NOVEL CORONAVIRUS, NAA (HOSP ORDER, SEND-OUT TO REF LAB; TAT 18-24 HRS)  INFLUENZA PANEL BY PCR (TYPE A & B)     PROCEDURES Procedures   INITIAL IMPRESSION / ASSESSMENT AND PLAN / ED COURSE  Pertinent labs & imaging results that were available during my care of the patient were reviewed by me and considered in my medical decision making (see chart for details).  Overall well-appearing.  Does appear sick.  Suspect viral illness.  Influenza negative.  Rapid Covid test positive.  Patient declined need for prescription medication.  Encourage rest, fluids, supportive care.  Work note given.  Patient states she will call and follow-up with her primary regarding potential for candidacy for infusion outpatient.  Discussed her follow-up and return parameters.  Discussed follow up and return parameters including no resolution or any worsening concerns. Patient verbalized understanding and agreed to plan.   ____________________________________________   FINAL CLINICAL IMPRESSION(S) / ED DIAGNOSES  Final diagnoses:  COVID-19  Viral illness     ED Discharge Orders    None       Note: This dictation was prepared with Dragon dictation along with smaller phrase technology. Any transcriptional errors that result from this process are unintentional.         Marylene Land, NP 06/27/19 1849

## 2019-06-27 NOTE — ED Triage Notes (Signed)
Pt c/o loss of smell and taste, abdominal pain, sore throat, weakness, cough, fatigue. Started 3 days ago. denies fever.

## 2019-06-27 NOTE — Discharge Instructions (Addendum)
Rest. Drink plenty of fluids. Tylenol and ibuprofen as needed. Monitor.   Follow up with your primary care physician as discussed. Return to Urgent care or ER for new or worsening concerns.

## 2019-06-30 ENCOUNTER — Telehealth: Payer: Self-pay | Admitting: Unknown Physician Specialty

## 2019-06-30 NOTE — Telephone Encounter (Signed)
Called to discuss with patient about Covid symptoms and the use of bamlanivimab, a monoclonal antibody infusion for those with mild to moderate Covid symptoms and at a high risk of hospitalization.   Message left to call back  

## 2019-06-30 NOTE — Telephone Encounter (Signed)
Discussed with pt mab infusion.  She does not meet the co-morbid criteria for infusion at this time

## 2019-07-01 ENCOUNTER — Encounter (HOSPITAL_COMMUNITY): Payer: Self-pay

## 2019-07-01 ENCOUNTER — Telehealth (HOSPITAL_COMMUNITY): Payer: Self-pay | Admitting: Emergency Medicine

## 2019-07-01 NOTE — Telephone Encounter (Signed)
Your and your son Joel's test for COVID-19 was positive, meaning that you were infected with the novel coronavirus and could give the germ to others.  Please continue isolation at home for at least 10 days since the start of your symptoms. If you do not have symptoms, please isolate at home for 10 days from the day you were tested. Once you complete your 10 day quarantine, you may return to normal activities as long as you've not had a fever for over 24 hours(without taking fever reducing medicine) and your symptoms are improving. Please continue good preventive care measures, including:  frequent hand-washing, avoid touching your face, cover coughs/sneezes, stay out of crowds and keep a 6 foot distance from others.  Go to the nearest hospital emergency room if fever/cough/breathlessness are severe or illness seems like a threat to life.    Attempted to reach patient. No answer at this time. Voicemail left.   If you have any questions, you may call me at [phone]

## 2019-07-03 LAB — NOVEL CORONAVIRUS, NAA (HOSP ORDER, SEND-OUT TO REF LAB; TAT 18-24 HRS): SARS-CoV-2, NAA: DETECTED — AB

## 2020-06-25 ENCOUNTER — Ambulatory Visit: Payer: No Typology Code available for payment source | Attending: Internal Medicine

## 2020-06-25 DIAGNOSIS — Z23 Encounter for immunization: Secondary | ICD-10-CM

## 2020-06-25 NOTE — Progress Notes (Signed)
   Covid-19 Vaccination Clinic  Name:  Kaitlyn Pearson    MRN: [id number] DOB: 17-Feb-1976  06/25/2020  Kaitlyn Pearson was observed post Covid-19 immunization for 15 minutes without incident. She was provided with Vaccine Information Sheet and instruction to access the V-Safe system.   Kaitlyn Pearson was instructed to call 911 with any severe reactions post vaccine: Marland Kitchen Difficulty breathing  . Swelling of face and throat  . A fast heartbeat  . A bad rash all over body  . Dizziness and weakness   Immunizations Administered    Name Date Dose VIS Date Route   Pfizer COVID-19 Vaccine 06/25/2020  1:40 PM 0.3 mL 04/15/2020 Intramuscular   Manufacturer: [employer], [employer]   Lot: [id number]   NDC: [address]-1

## 2020-07-20 ENCOUNTER — Other Ambulatory Visit: Payer: No Typology Code available for payment source

## 2020-09-16 IMAGING — MR MR HEAD W/O CM
11 series · 48 of 48 positions shown · non-contrast
Comparison: Head CT without contrast 0291 hours today.

CLINICAL DATA: 42-year-old female code stroke presentation.
Perioral and left side numbness onset at 3356 hours.

EXAM:
MRI HEAD WITHOUT CONTRAST
TECHNIQUE: Multiplanar, multiecho pulse sequences of the brain and surrounding
structures were obtained without intravenous contrast.

[Series 5: ax dwi_tracew · axial · 3.0mm · 0.83mm/px · z∈[-103,+58]mm · 6 of 55 slices shown]
[im 1/55]
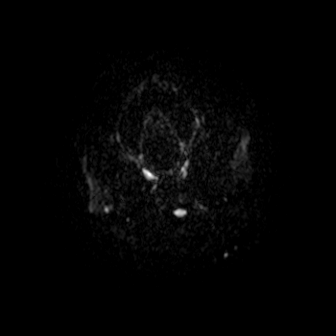
[im 11/55]
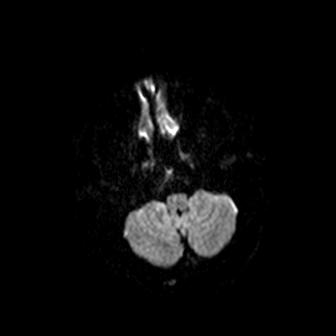
[im 22/55]
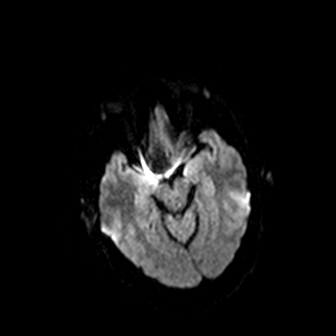
[im 33/55]
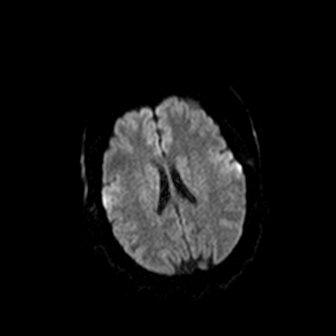
[im 44/55]
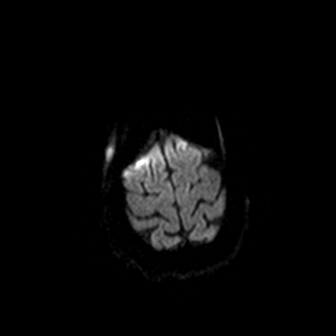
[im 55/55]
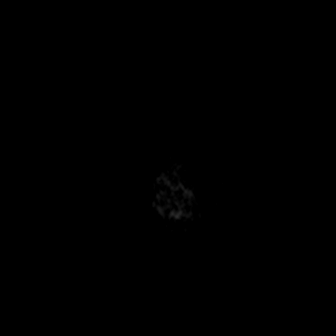

[Series 6: ax dwi_adc · axial · 3.0mm · 0.83mm/px · z∈[-103,+58]mm · 5 of 55 slices shown]
[im 1/55]
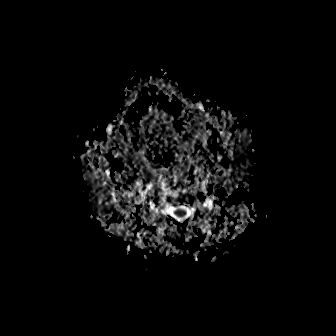
[im 14/55]
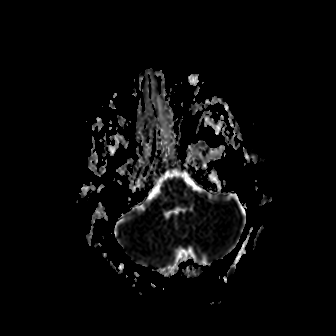
[im 28/55]
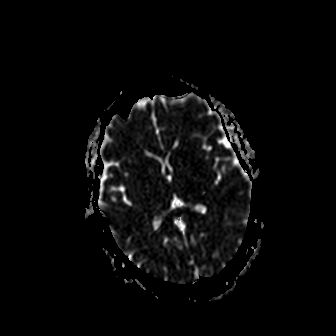
[im 41/55]
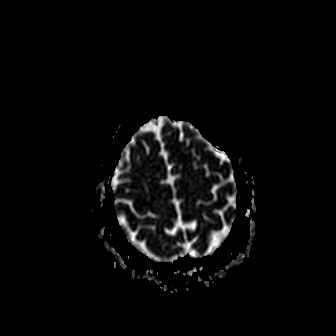
[im 55/55]
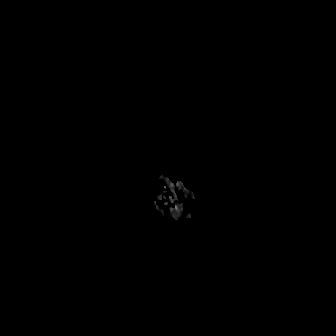

[Series 7: cor dwi_tracew · coronal · 5.0mm · 0.68mm/px · 3 of 39 slices shown]
[im 1/39]
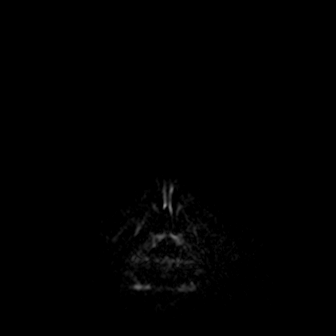
[im 20/39]
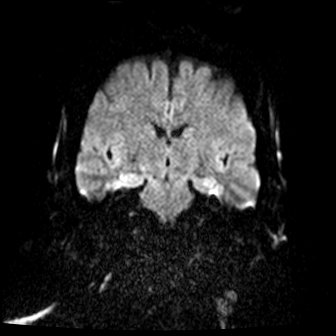
[im 39/39]
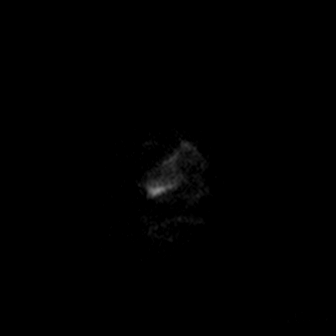

[Series 8: cor dwi_adc · coronal · 5.0mm · 0.68mm/px · 3 of 39 slices shown]
[im 1/39]
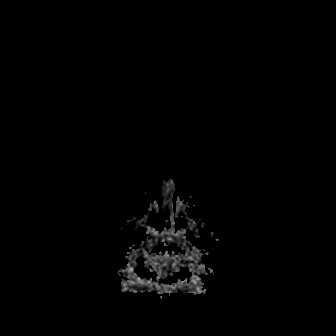
[im 20/39]
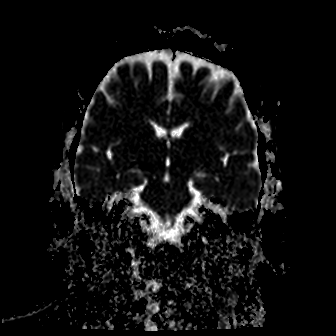
[im 39/39]
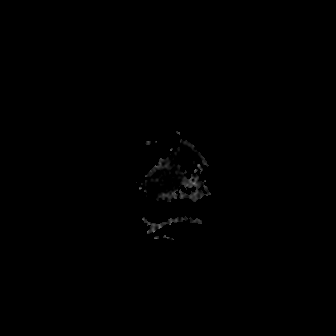

[Series 9: T1 · sagittal · 5.0mm · 0.62mm/px · 2 of 25 slices shown (1 of 2)]
[im 1/25]
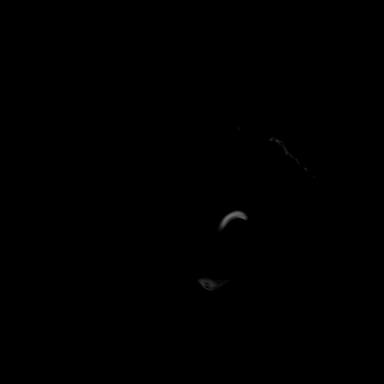
[im 25/25]
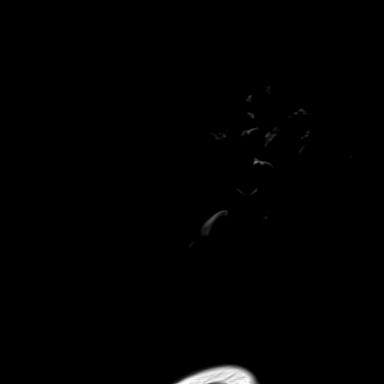

[Series 10: T2 · axial · 5.0mm · 0.45mm/px · z∈[-97,+59]mm · 2 of 27 slices shown (1 of 2)]
[im 1/27]
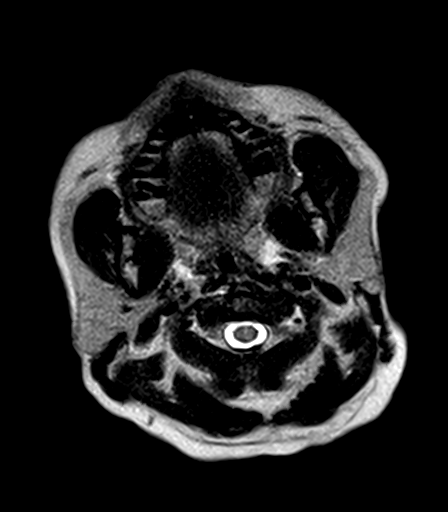
[im 27/27]
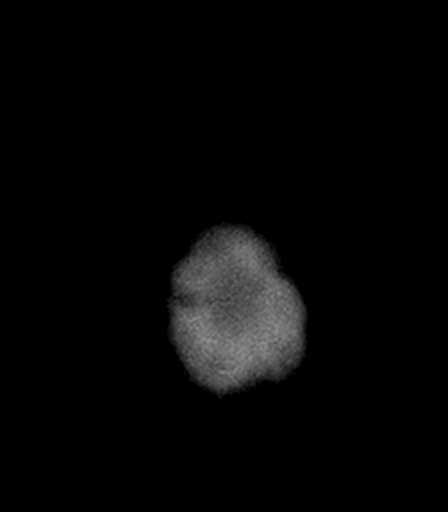

[Series 11: swi_images · axial · 3.0mm · 0.90mm/px · z∈[-96,+81]mm · 5 of 60 slices shown]
[im 1/60]
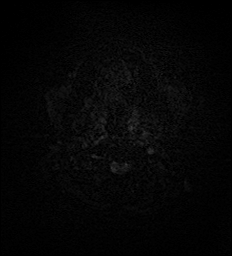
[im 15/60]
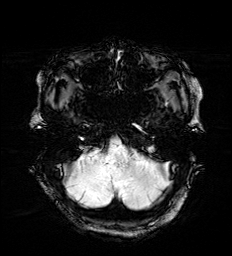
[im 30/60]
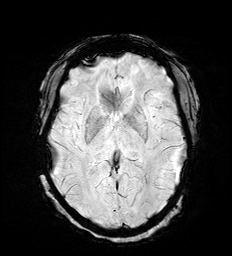
[im 45/60]
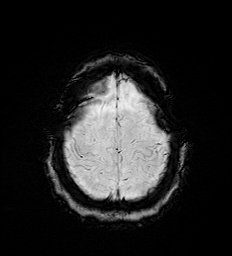
[im 60/60]
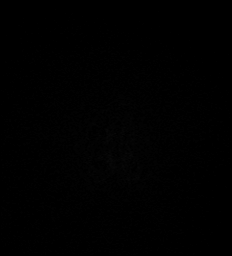

[Series 12: mip_images(sw) · axial · 24.0mm · 0.90mm/px · z∈[-85,+70]mm · 4 of 53 slices shown]
[im 1/53]
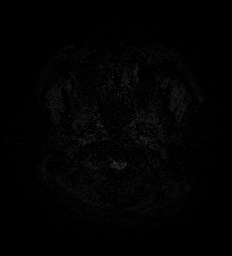
[im 18/53]
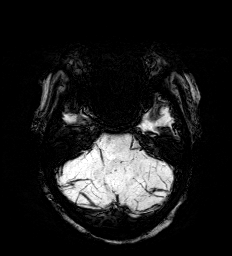
[im 35/53]
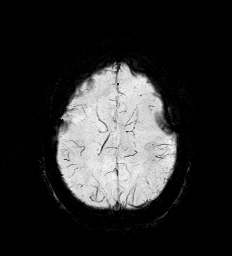
[im 53/53]
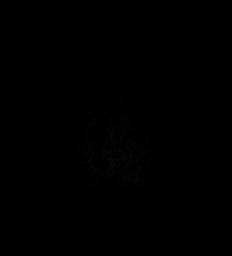

[Series 13: FLAIR · axial · 5.0mm · 1.20mm/px · z∈[-97,+59]mm · 2 of 27 slices shown]
[im 1/27]
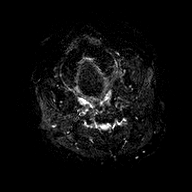
[im 27/27]
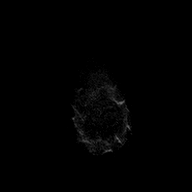

[Series 14: T1 · axial · 1.0mm · 0.98mm/px · z∈[-93,+65]mm · 13 of 160 slices shown (2 of 2)]
[im 1/160]
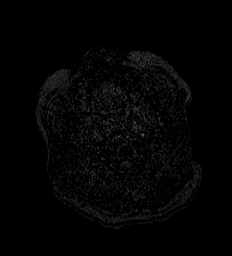
[im 14/160]
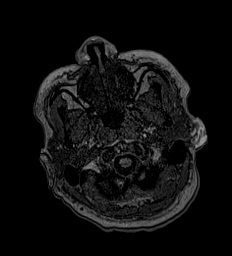
[im 27/160]
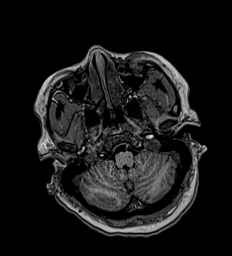
[im 40/160]
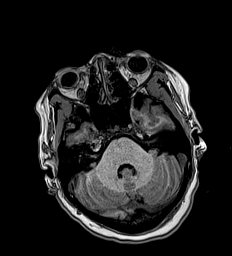
[im 54/160]
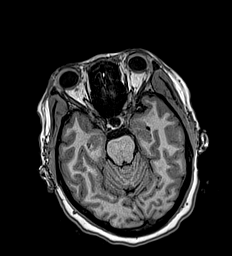
[im 67/160]
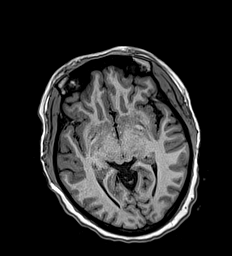
[im 80/160]
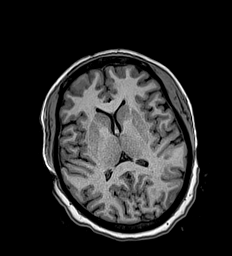
[im 93/160]
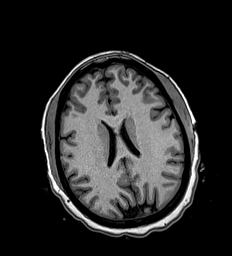
[im 107/160]
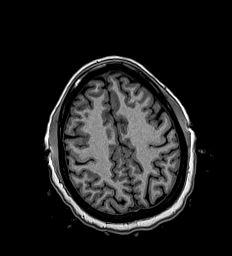
[im 120/160]
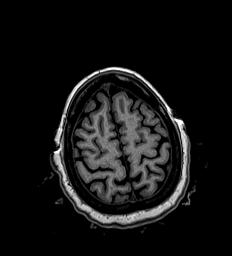
[im 133/160]
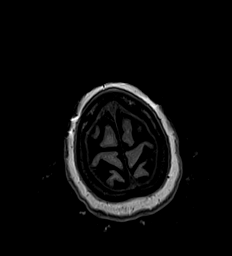
[im 146/160]
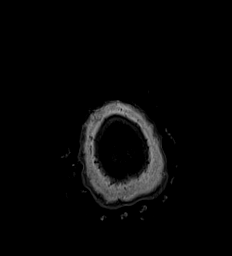
[im 160/160]
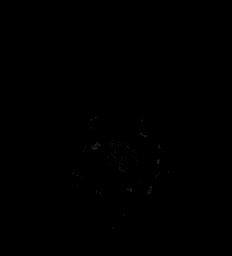

[Series 15: T2 · coronal · 5.0mm · 0.45mm/px · 3 of 30 slices shown (2 of 2)]
[im 1/30]
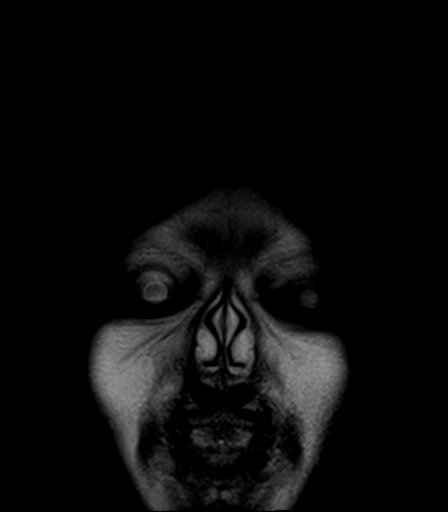
[im 15/30]
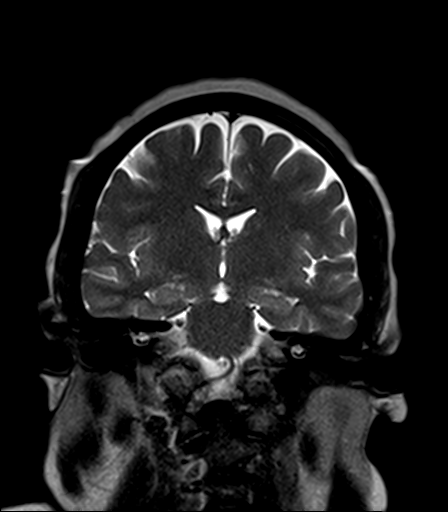
[im 30/30]
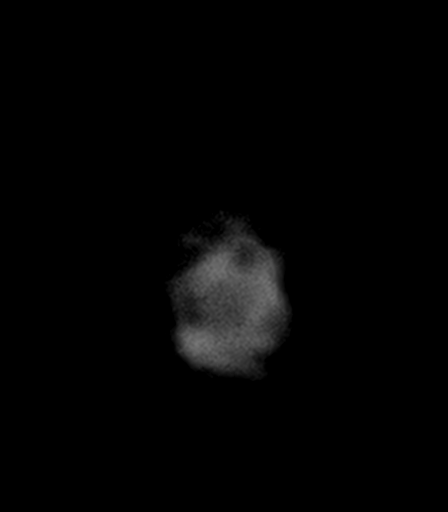

[48 of 48 positions shown; findings below may reference images not displayed]

FINDINGS: Brain: No restricted diffusion or evidence of acute infarction.

Gray and white matter signal is within normal limits throughout the
brain. No encephalomalacia or chronic cerebral blood products. No
midline shift, mass effect, evidence of mass lesion,
ventriculomegaly, extra-axial collection or acute intracranial
hemorrhage. Cervicomedullary junction and pituitary are within
normal limits.

Vascular: Major intracranial vascular flow voids are preserved.

Skull and upper cervical spine: Negative visible cervical spine.
Visualized bone marrow signal is within normal limits.

Sinuses/Orbits: Normal orbits soft tissues. Paranasal sinuses and
mastoids are stable and well pneumatized.

Other: Visible internal auditory structures appear normal. Scalp and
face soft tissues appear negative.
IMPRESSION: Normal MRI appearance of the brain.

## 2020-12-21 DIAGNOSIS — Z01419 Encounter for gynecological examination (general) (routine) without abnormal findings: Secondary | ICD-10-CM | POA: Diagnosis not present

## 2020-12-21 DIAGNOSIS — R6882 Decreased libido: Secondary | ICD-10-CM | POA: Diagnosis not present

## 2020-12-21 DIAGNOSIS — R102 Pelvic and perineal pain: Secondary | ICD-10-CM | POA: Diagnosis not present

## 2021-01-11 DIAGNOSIS — Z01419 Encounter for gynecological examination (general) (routine) without abnormal findings: Secondary | ICD-10-CM | POA: Diagnosis not present

## 2021-01-11 DIAGNOSIS — R102 Pelvic and perineal pain: Secondary | ICD-10-CM | POA: Diagnosis not present

## 2021-01-11 DIAGNOSIS — Z1231 Encounter for screening mammogram for malignant neoplasm of breast: Secondary | ICD-10-CM | POA: Diagnosis not present

## 2022-07-12 DIAGNOSIS — Z0000 Encounter for general adult medical examination without abnormal findings: Secondary | ICD-10-CM | POA: Diagnosis not present

## 2023-05-10 ENCOUNTER — Other Ambulatory Visit: Payer: Self-pay

## 2023-05-10 MED ORDER — FLULAVAL 0.5 ML IM SUSY
0.5000 mL | PREFILLED_SYRINGE | Freq: Once | INTRAMUSCULAR | 0 refills | Status: AC
  Filled 2023-05-10: qty 0.5, 1d supply, fill #0

## 2023-07-07 DIAGNOSIS — Z13220 Encounter for screening for lipoid disorders: Secondary | ICD-10-CM | POA: Diagnosis not present

## 2023-07-07 DIAGNOSIS — Z0000 Encounter for general adult medical examination without abnormal findings: Secondary | ICD-10-CM | POA: Diagnosis not present

## 2023-07-14 DIAGNOSIS — K581 Irritable bowel syndrome with constipation: Secondary | ICD-10-CM | POA: Diagnosis not present

## 2023-07-14 DIAGNOSIS — E782 Mixed hyperlipidemia: Secondary | ICD-10-CM | POA: Diagnosis not present

## 2023-07-14 DIAGNOSIS — Z0000 Encounter for general adult medical examination without abnormal findings: Secondary | ICD-10-CM | POA: Diagnosis not present

## 2023-07-14 DIAGNOSIS — Z1231 Encounter for screening mammogram for malignant neoplasm of breast: Secondary | ICD-10-CM | POA: Diagnosis not present

## 2023-07-18 ENCOUNTER — Other Ambulatory Visit: Payer: Self-pay | Admitting: Internal Medicine

## 2023-07-18 DIAGNOSIS — E782 Mixed hyperlipidemia: Secondary | ICD-10-CM

## 2023-07-18 DIAGNOSIS — Z0000 Encounter for general adult medical examination without abnormal findings: Secondary | ICD-10-CM

## 2023-07-25 ENCOUNTER — Other Ambulatory Visit: Payer: Self-pay | Admitting: Urology

## 2023-07-25 DIAGNOSIS — R3129 Other microscopic hematuria: Secondary | ICD-10-CM

## 2023-07-27 ENCOUNTER — Inpatient Hospital Stay: Admission: RE | Admit: 2023-07-27 | Payer: 59 | Source: Ambulatory Visit

## 2023-07-27 ENCOUNTER — Ambulatory Visit
Admission: RE | Admit: 2023-07-27 | Discharge: 2023-07-27 | Disposition: A | Discharge status: Home / Self Care | Payer: 59 | Source: Ambulatory Visit | Attending: Urology | Admitting: Urology

## 2023-07-27 ENCOUNTER — Ambulatory Visit
Admission: RE | Admit: 2023-07-27 | Discharge: 2023-07-27 | Disposition: A | Discharge status: Home / Self Care | Payer: Self-pay | Source: Ambulatory Visit | Attending: Internal Medicine | Admitting: Internal Medicine

## 2023-07-27 ENCOUNTER — Ambulatory Visit: Admission: RE | Admit: 2023-07-27 | Payer: Self-pay | Source: Ambulatory Visit

## 2023-07-27 DIAGNOSIS — R3129 Other microscopic hematuria: Secondary | ICD-10-CM | POA: Diagnosis not present

## 2023-07-27 DIAGNOSIS — E782 Mixed hyperlipidemia: Secondary | ICD-10-CM | POA: Insufficient documentation

## 2023-07-27 DIAGNOSIS — Z0000 Encounter for general adult medical examination without abnormal findings: Secondary | ICD-10-CM | POA: Insufficient documentation

## 2023-07-27 DIAGNOSIS — N281 Cyst of kidney, acquired: Secondary | ICD-10-CM | POA: Diagnosis not present

## 2023-07-27 MED ORDER — IOHEXOL 300 MG/ML  SOLN
100.0000 mL | Freq: Once | INTRAMUSCULAR | Status: AC | PRN
  Administered 2023-07-27: 100 mL via INTRAVENOUS

## 2023-08-11 ENCOUNTER — Encounter: Payer: Self-pay | Admitting: Urology

## 2023-08-14 ENCOUNTER — Other Ambulatory Visit: Payer: Self-pay | Admitting: *Deleted

## 2023-08-14 DIAGNOSIS — R3129 Other microscopic hematuria: Secondary | ICD-10-CM

## 2023-08-14 NOTE — Telephone Encounter (Signed)
 PT called back requesting to cancel cysto appt due to her traveling out of the country. She will call office when she returns to reschedule cysto.

## 2023-09-21 ENCOUNTER — Ambulatory Visit: Payer: 59 | Admitting: Urology

## 2024-07-24 ENCOUNTER — Other Ambulatory Visit: Payer: Self-pay | Admitting: Internal Medicine

## 2024-07-24 DIAGNOSIS — Z1231 Encounter for screening mammogram for malignant neoplasm of breast: Secondary | ICD-10-CM
